# Patient Record
Sex: Female | Born: 1950 | Hispanic: No | Marital: Married
Health system: Southern US, Community
[De-identification: ages and names within clinical notes are randomized; demographics above are authoritative.]

---

## 2015-11-17 ENCOUNTER — Other Ambulatory Visit (HOSPITAL_COMMUNITY): Payer: Self-pay

## 2015-11-17 ENCOUNTER — Inpatient Hospital Stay
Admission: AD | Admit: 2015-11-17 | Discharge: 2015-12-16 | Disposition: A | Discharge status: Skilled Nursing Facility | Payer: Self-pay | Source: Ambulatory Visit | Attending: Internal Medicine | Admitting: Internal Medicine

## 2015-11-17 DIAGNOSIS — R4182 Altered mental status, unspecified: Secondary | ICD-10-CM | POA: Insufficient documentation

## 2015-11-17 DIAGNOSIS — J9690 Respiratory failure, unspecified, unspecified whether with hypoxia or hypercapnia: Secondary | ICD-10-CM

## 2015-11-17 DIAGNOSIS — Z4659 Encounter for fitting and adjustment of other gastrointestinal appliance and device: Secondary | ICD-10-CM

## 2015-11-17 DIAGNOSIS — N390 Urinary tract infection, site not specified: Secondary | ICD-10-CM | POA: Insufficient documentation

## 2015-11-17 DIAGNOSIS — J9611 Chronic respiratory failure with hypoxia: Secondary | ICD-10-CM | POA: Insufficient documentation

## 2015-11-17 DIAGNOSIS — Z930 Tracheostomy status: Secondary | ICD-10-CM | POA: Insufficient documentation

## 2015-11-17 DIAGNOSIS — J189 Pneumonia, unspecified organism: Secondary | ICD-10-CM

## 2015-11-17 DIAGNOSIS — J9600 Acute respiratory failure, unspecified whether with hypoxia or hypercapnia: Secondary | ICD-10-CM

## 2015-11-18 DIAGNOSIS — J9611 Chronic respiratory failure with hypoxia: Secondary | ICD-10-CM | POA: Insufficient documentation

## 2015-11-18 DIAGNOSIS — J9601 Acute respiratory failure with hypoxia: Secondary | ICD-10-CM

## 2015-11-18 DIAGNOSIS — R0902 Hypoxemia: Secondary | ICD-10-CM

## 2015-11-18 DIAGNOSIS — N390 Urinary tract infection, site not specified: Secondary | ICD-10-CM | POA: Insufficient documentation

## 2015-11-18 DIAGNOSIS — R4182 Altered mental status, unspecified: Secondary | ICD-10-CM | POA: Insufficient documentation

## 2015-11-18 DIAGNOSIS — J810 Acute pulmonary edema: Secondary | ICD-10-CM

## 2015-11-18 DIAGNOSIS — Z930 Tracheostomy status: Secondary | ICD-10-CM

## 2015-11-18 LAB — COMPREHENSIVE METABOLIC PANEL
ALBUMIN: 2.5 g/dL — AB (ref 3.5–5.0)
ALT: 60 U/L — AB (ref 14–54)
ANION GAP: 11 (ref 5–15)
AST: 80 U/L — ABNORMAL HIGH (ref 15–41)
Alkaline Phosphatase: 87 U/L (ref 38–126)
BUN: 35 mg/dL — ABNORMAL HIGH (ref 6–20)
CHLORIDE: 116 mmol/L — AB (ref 101–111)
CO2: 21 mmol/L — AB (ref 22–32)
CREATININE: 1.09 mg/dL — AB (ref 0.44–1.00)
Calcium: 9.3 mg/dL (ref 8.9–10.3)
GFR calc non Af Amer: 53 mL/min — ABNORMAL LOW (ref >60)
GLUCOSE: 178 mg/dL — AB (ref 65–99)
Potassium: 4.4 mmol/L (ref 3.5–5.1)
SODIUM: 148 mmol/L — AB (ref 135–145)
Total Bilirubin: 1 mg/dL (ref 0.3–1.2)
Total Protein: 7.8 g/dL (ref 6.5–8.1)

## 2015-11-18 LAB — BLOOD GAS, ARTERIAL
Acid-base deficit: 2.1 mmol/L — ABNORMAL HIGH (ref 0.0–2.0)
BICARBONATE: 22.1 meq/L (ref 20.0–24.0)
FIO2: 0.3
O2 Saturation: 98.6 %
PATIENT TEMPERATURE: 98.6
PEEP: 5 cmH2O
RATE: 14 resp/min
TCO2: 23.2 mmol/L (ref 0–100)
VT: 500 mL
pCO2 arterial: 36.8 mmHg (ref 35.0–45.0)
pH, Arterial: 7.395 (ref 7.350–7.450)
pO2, Arterial: 131 mmHg — ABNORMAL HIGH (ref 80.0–100.0)

## 2015-11-18 LAB — CBC
HCT: 27.7 % — ABNORMAL LOW (ref 36.0–46.0)
Hemoglobin: 8.3 g/dL — ABNORMAL LOW (ref 12.0–15.0)
MCH: 28.2 pg (ref 26.0–34.0)
MCHC: 30 g/dL (ref 30.0–36.0)
MCV: 94.2 fL (ref 78.0–100.0)
Platelets: 682 10*3/uL — ABNORMAL HIGH (ref 150–400)
RBC: 2.94 MIL/uL — AB (ref 3.87–5.11)
RDW: 15.4 % (ref 11.5–15.5)
WBC: 15.3 10*3/uL — ABNORMAL HIGH (ref 4.0–10.5)

## 2015-11-18 LAB — C DIFFICILE QUICK SCREEN W PCR REFLEX
C DIFFICILE (CDIFF) INTERP: NEGATIVE
C DIFFICILE (CDIFF) TOXIN: NEGATIVE
C Diff antigen: NEGATIVE

## 2015-11-18 LAB — URINALYSIS, ROUTINE W REFLEX MICROSCOPIC
BILIRUBIN URINE: NEGATIVE
GLUCOSE, UA: NEGATIVE mg/dL
KETONES UR: NEGATIVE mg/dL
NITRITE: NEGATIVE
PROTEIN: 100 mg/dL — AB
Specific Gravity, Urine: 1.016 (ref 1.005–1.030)
pH: 6 (ref 5.0–8.0)

## 2015-11-18 LAB — URINE MICROSCOPIC-ADD ON

## 2015-11-18 NOTE — Consult Note (Signed)
Name: GAYLON MELCHOR MRN: [id number] DOB: 1951/04/20    ADMISSION DATE:  11/17/2015 CONSULTATION DATE:  11/17/15  REFERRING MD :  Sharyon Medicus  CHIEF COMPLAINT:  Vent Management   HISTORY OF PRESENT ILLNESS:  ANNEKA STUDER is a 65 y.o. 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  She presented to [facility] 09/24/15 with acute bronchitis.  Due to concern for PE during early hospitalization, she was started on lovenox.  Her course was unfortunately complicated by retroperitoneal bleeding with RPH and subsequent hemorrhagic shock.  She received massive blood transfusions for several days / multiple occasions.  Her course was further complicated by Klebsiella pna as well as E.coli UTI (placed on zosyn, vanc, primaxin initially then transitioned to rocephin).  During her prolonged hospitalization, she required tracheostomy and was briefly placed on intermittent HD for acute renal failure which has since resolved. She had ongoing anemia which was eventually attributed to bone marrow suppression from HD, sepsis, and multiple transfusions.  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  She was listed as DNR prior to transfer but family was not ready for palliative care discussions. On her paperwork in [facility], code status is listed as limited code (no CPR or defibrillations).  PCCM was consulted for vent management.  PAST MEDICAL HISTORY :   has no past medical history on file.  has no past surgical history on file. Prior to Admission medications   Not on File   Allergies not on file  FAMILY HISTORY:  family history is not on file. SOCIAL HISTORY:    REVIEW OF  SYSTEMS:   Unable to obtain as pt is encephalopathic.   SUBJECTIVE:  On vent, trach in place.   VITAL SIGNS: T 98.3, P 95, R 24, BP 134 / 54, SpO2 98%.    PHYSICAL EXAMINATION: General: Chronically ill appearing female, resting in bed, in NAD. Neuro: Opens eyes. Does not follow all commands. HEENT: [place]/AT. PERRL, sclerae anicteric. Trach in place. Cardiovascular: RRR, no M/R/G.  Lungs: Respirations even and unlabored.  Coarse bilaterally. Abdomen: BS x 4, soft, NT/ND.  Musculoskeletal: No gross deformities, 1+ edema. Skin: Intact, warm, no rashes.     Recent Labs Lab 11/18/15 1158  NA 148*  K 4.4  CL 116*  CO2 21*  BUN 35*  CREATININE 1.09*  GLUCOSE 178*    Recent Labs Lab 11/18/15 1158  HGB 8.3*  HCT 27.7*  WBC 15.3*  PLT 682*   Dg Chest Port 1 View  11/18/2015  CLINICAL DATA:  65 year old female with NG tube placement EXAM: PORTABLE ABDOMEN - 1 VIEW; PORTABLE CHEST - 1 VIEW COMPARISON:  None. FINDINGS: An enteric tube is noted with tip in the left upper abdomen likely in the proximal stomach. Single portable view of the chest demonstrate mild cardiomegaly with increased central vascular prominence compatible with congestive changes. No focal consolidation, pleural effusion, or pneumothorax. Left subclavian central line with tip over the mediastinum noted. Evaluation of the positioning of the central line is limited due to patient rotation. No dilated bowel noted in the visualized abdomen. The osseous structures appear intact. IMPRESSION: Enteric tube with tip likely in the proximal stomach.  Mild cardiomegaly with mild congestive changes. Electronically Signed   By: Elgie Collard M.D.   On: 11/18/2015 01:01   Dg Abd Portable 1v  11/18/2015  CLINICAL DATA:  65 year old female with NG tube placement EXAM: PORTABLE ABDOMEN - 1 VIEW; PORTABLE CHEST - 1 VIEW COMPARISON:  None. FINDINGS: An enteric tube is noted with tip in the left upper abdomen likely in the proximal  stomach. Single portable view of the chest demonstrate mild cardiomegaly with increased central vascular prominence compatible with congestive changes. No focal consolidation, pleural effusion, or pneumothorax. Left subclavian central line with tip over the mediastinum noted. Evaluation of the positioning of the central line is limited due to patient rotation. No dilated bowel noted in the visualized abdomen. The osseous structures appear intact. IMPRESSION: Enteric tube with tip likely in the proximal stomach. Mild cardiomegaly with mild congestive changes. Electronically Signed   By: Elgie Collard M.D.   On: 11/18/2015 01:01    STUDIES:  CXR 02/15 > mild cardiomegaly.  SIGNIFICANT EVENTS  02/14 > admitted to [facility] after prolonged stay at [facility].  ASSESSMENT / PLAN:  VDRF - s/p tracheostomy due to failure to wean. Tracheostomy status. Hx klebsiella pna - completed abx course. Plan: Continue to wean per normal protocol. Continue BD's. Pulmonary hygiene. Follow CXR.  CC time:  30 minutes.  Rest per primary team.    Rutherford Guys, PA - [facility] [place] [facility] Pager: [phone]  or [phone] 11/18/2015, 4:52 PM   Attending Note:  65 year old female with extensive PMH presenting to [facility] after a prolonged hospitalization in [facility] where she presented with bronchitis that was later found to be HCAP, had bleeding due to lovenox as well. Was intubated, unable to extubate patient was trached. Also had AKI that has not resolved. In [facility] for rehab. I reviewed CXR myself with cardiomegaly noted as well as some signs of CHF. Discussed with SSH-MD and PCCM-NP. On exam, very coarse BS in all lung fields.  Acute respiratory failure due to HCAP and pulmonary edema. - Full vent support. - Begin weaning per protocol in AM.  Hypoxemia: due to above. - Titrate O2 for sat of 88-92%. - PEEP  to 5.  Pulmonary edema: - Diureses. - 2D echo.  Tracheostomy status: - Maintain cuffed trach. - Maintain current size.  Patient seen and examined, agree with above note. I dictated the care and orders written for this patient under my direction.  Alyson Reedy, MD [phone]

## 2015-11-19 DIAGNOSIS — J9611 Chronic respiratory failure with hypoxia: Secondary | ICD-10-CM

## 2015-11-19 NOTE — Progress Notes (Signed)
Name: Nicole Weiss MRN: [id number] DOB: Oct 23, 1950    ADMISSION DATE:  11/17/2015 CONSULTATION DATE:  11/17/15  REFERRING MD :  Sharyon Medicus  CHIEF COMPLAINT:  Vent Management   HISTORY OF PRESENT ILLNESS:  Nicole Weiss is a 65 y.o. 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  She presented to [facility] 09/24/15 with acute bronchitis.  Due to concern for PE during early hospitalization, she was started on lovenox.  Her course was unfortunately complicated by retroperitoneal bleeding with RPH and subsequent hemorrhagic shock.  She received massive blood transfusions for several days / multiple occasions.  Her course was further complicated by Klebsiella pna as well as E.coli UTI (placed on zosyn, vanc, primaxin initially then transitioned to rocephin).  During her prolonged hospitalization, she required tracheostomy and was briefly placed on intermittent HD for acute renal failure which has since resolved. She had ongoing anemia which was eventually attributed to bone marrow suppression from HD, sepsis, and multiple transfusions.  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  She was listed as DNR prior to transfer but family was not ready for palliative care discussions. On her paperwork in [facility], code status is listed as limited code (no CPR or defibrillations).  PCCM was consulted for vent management.    SUBJECTIVE:  On vent, trach in place. Tearful Tolerating pressure support 12/5   VITAL SIGNS: Afebrile, heart rate 90s, sinus on monitor,    PHYSICAL EXAMINATION: General: Chronically ill appearing female,  tearful Neuro: Opens eyes. Does not follow  all commands. HEENT: [place]/AT. PERRL, sclerae anicteric. Trach in place. Cardiovascular: RRR, no M/R/G.  Lungs: Respirations even and unlabored.  Coarse bilaterally. Abdomen: BS x 4, soft, NT/ND.  Musculoskeletal: No gross deformities, 1+ edema. Skin: Intact, warm, no rashes.     Recent Labs Lab 11/18/15 1158  NA 148*  K 4.4  CL 116*  CO2 21*  BUN 35*  CREATININE 1.09*  GLUCOSE 178*    Recent Labs Lab 11/18/15 1158  HGB 8.3*  HCT 27.7*  WBC 15.3*  PLT 682*   Dg Chest Port 1 View  11/18/2015  CLINICAL DATA:  65 year old female with NG tube placement EXAM: PORTABLE ABDOMEN - 1 VIEW; PORTABLE CHEST - 1 VIEW COMPARISON:  None. FINDINGS: An enteric tube is noted with tip in the left upper abdomen likely in the proximal stomach. Single portable view of the chest demonstrate mild cardiomegaly with increased central vascular prominence compatible with congestive changes. No focal consolidation, pleural effusion, or pneumothorax. Left subclavian central line with tip over the mediastinum noted. Evaluation of the positioning of the central line is limited due to patient rotation. No dilated bowel noted in the visualized abdomen. The osseous structures appear intact. IMPRESSION: Enteric tube with tip likely in the proximal stomach. Mild cardiomegaly with mild congestive changes. Electronically Signed   By: Elgie Collard M.D.   On: 11/18/2015 01:01   Dg Abd Portable 1v  11/18/2015  CLINICAL DATA:  65 year old female with NG tube placement EXAM: PORTABLE ABDOMEN - 1 VIEW; PORTABLE CHEST - 1 VIEW COMPARISON:  None. FINDINGS: An enteric tube is noted with tip in the left upper abdomen likely  in the proximal stomach. Single portable view of the chest demonstrate mild cardiomegaly with increased central vascular prominence compatible with congestive changes. No focal consolidation, pleural effusion, or pneumothorax. Left subclavian central line with tip over the mediastinum noted. Evaluation of the  positioning of the central line is limited due to patient rotation. No dilated bowel noted in the visualized abdomen. The osseous structures appear intact. IMPRESSION: Enteric tube with tip likely in the proximal stomach. Mild cardiomegaly with mild congestive changes. Electronically Signed   By: Elgie Collard M.D.   On: 11/18/2015 01:01    STUDIES:  CXR 02/15 > mild cardiomegaly.  SIGNIFICANT EVENTS  02/14 > admitted to [facility] after prolonged stay at [facility].  ASSESSMENT / PLAN:  VDRF - s/p tracheostomy due to failure to wean. Tracheostomy status. Hx klebsiella pna - completed abx course. Plan: Fast-track trach collar per protocol Continue BD's. Pulmonary hygiene. She would likely not be a candidate for decannulation  Pulmonary edema: - Diureses.   Dysphagia- continue nasogastric feeding tube, not a candidate for PEG placement due to prior bypass surgery, hope that we can progress to swallow evaluation once liberated from the ventilator   Cyril Mourning MD. FCCP. [place] [facility] Pager [phone] If no response call [phone]   11/19/2015, 12:08 PM

## 2015-11-20 ENCOUNTER — Other Ambulatory Visit (HOSPITAL_COMMUNITY): Payer: Self-pay

## 2015-11-20 LAB — BASIC METABOLIC PANEL
ANION GAP: 10 (ref 5–15)
BUN: 27 mg/dL — ABNORMAL HIGH (ref 6–20)
CHLORIDE: 113 mmol/L — AB (ref 101–111)
CO2: 20 mmol/L — ABNORMAL LOW (ref 22–32)
CREATININE: 0.96 mg/dL (ref 0.44–1.00)
Calcium: 9.4 mg/dL (ref 8.9–10.3)
GFR calc non Af Amer: >60 mL/min (ref >60)
Glucose, Bld: 169 mg/dL — ABNORMAL HIGH (ref 65–99)
POTASSIUM: 4.6 mmol/L (ref 3.5–5.1)
SODIUM: 143 mmol/L (ref 135–145)

## 2015-11-22 ENCOUNTER — Other Ambulatory Visit (HOSPITAL_COMMUNITY): Payer: Self-pay

## 2015-11-22 LAB — CBC
HEMATOCRIT: 29.1 % — AB (ref 36.0–46.0)
Hemoglobin: 8.4 g/dL — ABNORMAL LOW (ref 12.0–15.0)
MCH: 27.5 pg (ref 26.0–34.0)
MCHC: 28.9 g/dL — ABNORMAL LOW (ref 30.0–36.0)
MCV: 95.4 fL (ref 78.0–100.0)
Platelets: 642 10*3/uL — ABNORMAL HIGH (ref 150–400)
RBC: 3.05 MIL/uL — ABNORMAL LOW (ref 3.87–5.11)
RDW: 15.3 % (ref 11.5–15.5)
WBC: 16 10*3/uL — AB (ref 4.0–10.5)

## 2015-11-22 LAB — BASIC METABOLIC PANEL
ANION GAP: 13 (ref 5–15)
BUN: 28 mg/dL — ABNORMAL HIGH (ref 6–20)
CO2: 20 mmol/L — AB (ref 22–32)
Calcium: 9.3 mg/dL (ref 8.9–10.3)
Chloride: 109 mmol/L (ref 101–111)
Creatinine, Ser: 0.88 mg/dL (ref 0.44–1.00)
GFR calc Af Amer: >60 mL/min (ref >60)
GFR calc non Af Amer: >60 mL/min (ref >60)
Glucose, Bld: 159 mg/dL — ABNORMAL HIGH (ref 65–99)
Potassium: 5 mmol/L (ref 3.5–5.1)
Sodium: 142 mmol/L (ref 135–145)

## 2015-11-23 LAB — CBC
HCT: 28.1 % — ABNORMAL LOW (ref 36.0–46.0)
Hemoglobin: 8.5 g/dL — ABNORMAL LOW (ref 12.0–15.0)
MCH: 28.1 pg (ref 26.0–34.0)
MCHC: 30.2 g/dL (ref 30.0–36.0)
MCV: 92.7 fL (ref 78.0–100.0)
PLATELETS: 634 10*3/uL — AB (ref 150–400)
RBC: 3.03 MIL/uL — AB (ref 3.87–5.11)
RDW: 15.3 % (ref 11.5–15.5)
WBC: 17.4 10*3/uL — AB (ref 4.0–10.5)

## 2015-11-23 NOTE — Progress Notes (Signed)
Name: Nicole Weiss MRN: [id number] DOB: [birth year]    ADMISSION DATE:  11/17/2015 CONSULTATION DATE:  11/17/15  REFERRING MD :  Sharyon Medicus  CHIEF COMPLAINT:  Vent Management   HISTORY OF PRESENT ILLNESS:  Nicole Weiss is a 65 y.o. 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  She presented to [facility] 09/24/15 with acute bronchitis.  Due to concern for PE during early hospitalization, she was started on lovenox.  Her course was unfortunately complicated by retroperitoneal bleeding with RPH and subsequent hemorrhagic shock.  She received massive blood transfusions for several days / multiple occasions.  Her course was further complicated by Klebsiella pna as well as E.coli UTI (placed on zosyn, vanc, primaxin initially then transitioned to rocephin).  During her prolonged hospitalization, she required tracheostomy and was briefly placed on intermittent HD for acute renal failure which has since resolved. She had ongoing anemia which was eventually attributed to bone marrow suppression from HD, sepsis, and multiple transfusions.  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  She was listed as DNR prior to transfer but family was not ready for palliative care discussions. On her paperwork in [facility], code status is listed as limited code (no CPR or defibrillations).  PCCM was consulted for vent management.    SUBJECTIVE:   Has been on ATC x 48h   VITAL SIGNS: Afebrile, heart rate 90s, sinus on monitor,    PHYSICAL EXAMINATION: General: Chronically ill appearing female,  tearful Neuro: Opens eyes. Does not follow all commands. HEENT: [place]/AT. PERRL, sclerae  anicteric. Trach in place. Cardiovascular: RRR, no M/R/G.  Lungs: Respirations even and unlabored.  Coarse bilaterally. Abdomen: BS x 4, soft, NT/ND.  Musculoskeletal: No gross deformities, 1+ edema. Skin: Intact, warm, no rashes.     Recent Labs Lab 11/18/15 1158 11/20/15 1130 11/22/15 0535  NA 148* 143 142  K 4.4 4.6 5.0  CL 116* 113* 109  CO2 21* 20* 20*  BUN 35* 27* 28*  CREATININE 1.09* 0.96 0.88  GLUCOSE 178* 169* 159*    Recent Labs Lab 11/18/15 1158 11/22/15 0535 11/23/15 0707  HGB 8.3* 8.4* 8.5*  HCT 27.7* 29.1* 28.1*  WBC 15.3* 16.0* 17.4*  PLT 682* 642* 634*   Dg Chest Port 1 View  11/22/2015  CLINICAL DATA:  Pneumonia EXAM: PORTABLE CHEST 1 VIEW COMPARISON:  11/17/2015 chest radiograph. FINDINGS: Tracheostomy tube tip overlies the tracheal air column at the thoracic inlet. Left internal jugular central venous catheter terminates in the right atrium near the cavoatrial junction. Stable cardiomediastinal silhouette with mild cardiomegaly. No pneumothorax. Stable small left pleural effusion. No right pleural effusion. Low lung volumes. Vascular crowding without overt pulmonary edema. Improved aeration at the left lung base, with decreased hazy left lower lobe opacity. Platelike atelectasis at the right lung base, stable. IMPRESSION: 1. Low lung volumes with stable platelike atelectasis at the right lung base and decreased hazy left lower lobe opacity, either atelectasis or pneumonia. 2. Stable small left pleural effusion. Electronically Signed   By: Delbert Phenix M.D.   On: 11/22/2015 09:45    STUDIES:  CXR 02/15 > mild cardiomegaly.  SIGNIFICANT EVENTS  02/14 > admitted  to [facility] after prolonged stay at [facility].  ASSESSMENT / PLAN:  VDRF - s/p tracheostomy due to failure to wean. Tracheostomy status. Hx klebsiella pna - completed abx course. Plan: Continue Trach collar 24x7 Continue BD's. Pulmonary hygiene. She would likely not be a candidate for  decannulation Assess for cuff down and PMV this week  Pulmonary edema: - Diureses.   Dysphagia- continue nasogastric feeding tube, not a candidate for PEG placement due to prior bypass surgery, hope that we can progress to swallow evaluation once liberated from the ventilator   Levy Pupa, MD, PhD 11/23/2015, 12:19 PM [place] [facility] [phone] or if no answer [phone]

## 2015-11-24 LAB — CBC
HCT: 28.2 % — ABNORMAL LOW (ref 36.0–46.0)
Hemoglobin: 8.3 g/dL — ABNORMAL LOW (ref 12.0–15.0)
MCH: 27.7 pg (ref 26.0–34.0)
MCHC: 29.4 g/dL — ABNORMAL LOW (ref 30.0–36.0)
MCV: 94 fL (ref 78.0–100.0)
PLATELETS: 646 10*3/uL — AB (ref 150–400)
RBC: 3 MIL/uL — AB (ref 3.87–5.11)
RDW: 15.2 % (ref 11.5–15.5)
WBC: 17.1 10*3/uL — ABNORMAL HIGH (ref 4.0–10.5)

## 2015-11-24 LAB — BASIC METABOLIC PANEL
Anion gap: 11 (ref 5–15)
BUN: 28 mg/dL — AB (ref 6–20)
CO2: 22 mmol/L (ref 22–32)
CREATININE: 0.93 mg/dL (ref 0.44–1.00)
Calcium: 9.4 mg/dL (ref 8.9–10.3)
Chloride: 106 mmol/L (ref 101–111)
Glucose, Bld: 179 mg/dL — ABNORMAL HIGH (ref 65–99)
POTASSIUM: 4.1 mmol/L (ref 3.5–5.1)
SODIUM: 139 mmol/L (ref 135–145)

## 2015-11-24 LAB — MAGNESIUM: MAGNESIUM: 1.5 mg/dL — AB (ref 1.7–2.4)

## 2015-11-24 LAB — PHOSPHORUS: Phosphorus: 4 mg/dL (ref 2.5–4.6)

## 2015-11-25 LAB — CULTURE, RESPIRATORY
CULTURE: NO GROWTH
GRAM STAIN: NONE SEEN

## 2015-11-25 LAB — BASIC METABOLIC PANEL
Anion gap: 11 (ref 5–15)
BUN: 26 mg/dL — AB (ref 6–20)
CO2: 24 mmol/L (ref 22–32)
CREATININE: 0.86 mg/dL (ref 0.44–1.00)
Calcium: 9.2 mg/dL (ref 8.9–10.3)
Chloride: 104 mmol/L (ref 101–111)
GFR calc Af Amer: >60 mL/min (ref >60)
Glucose, Bld: 178 mg/dL — ABNORMAL HIGH (ref 65–99)
POTASSIUM: 4.1 mmol/L (ref 3.5–5.1)
SODIUM: 139 mmol/L (ref 135–145)

## 2015-11-25 LAB — CBC
HCT: 27 % — ABNORMAL LOW (ref 36.0–46.0)
Hemoglobin: 8.4 g/dL — ABNORMAL LOW (ref 12.0–15.0)
MCH: 29.5 pg (ref 26.0–34.0)
MCHC: 31.1 g/dL (ref 30.0–36.0)
MCV: 94.7 fL (ref 78.0–100.0)
PLATELETS: 561 10*3/uL — AB (ref 150–400)
RBC: 2.85 MIL/uL — AB (ref 3.87–5.11)
RDW: 15.4 % (ref 11.5–15.5)
WBC: 15.7 10*3/uL — ABNORMAL HIGH (ref 4.0–10.5)

## 2015-11-25 LAB — CULTURE, RESPIRATORY W GRAM STAIN

## 2015-11-26 NOTE — Progress Notes (Signed)
Name: AWA BACHICHA MRN: [id number] DOB: April 22, 1951    ADMISSION DATE:  11/17/2015 CONSULTATION DATE:  11/17/15  REFERRING MD :  Sharyon Medicus  CHIEF COMPLAINT:  Vent Management   HISTORY OF PRESENT ILLNESS:  Nicole Weiss is a 65 y.o. 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  She presented to [facility] 09/24/15 with acute bronchitis.  Due to concern for PE during early hospitalization, she was started on lovenox.  Her course was unfortunately complicated by retroperitoneal bleeding with RPH and subsequent hemorrhagic shock.  She received massive blood transfusions for several days / multiple occasions.  Her course was further complicated by Klebsiella pna as well as E.coli UTI (placed on zosyn, vanc, primaxin initially then transitioned to rocephin).  During her prolonged hospitalization, she required tracheostomy and was briefly placed on intermittent HD for acute renal failure which has since resolved. She had ongoing anemia which was eventually attributed to bone marrow suppression from HD, sepsis, and multiple transfusions.  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  She was listed as DNR prior to transfer but family was not ready for palliative care discussions. On her paperwork in [facility], code status is listed as limited code (no CPR or defibrillations).  PCCM was consulted for vent management.    SUBJECTIVE:   On trach collar 20 hr goal   VITAL SIGNS: Afebrile, heart rate 90s, sinus on monitor,    PHYSICAL EXAMINATION: General: Chronically ill appearing female Neuro: Opens eyes. follows all commands. HEENT: [place]/AT. PERRL, sclerae anicteric.  Trach in place. Cardiovascular: RRR, no M/R/G.  Lungs: coarse reduced Abdomen: BS x 4, soft, NT/ND.  Musculoskeletal: No gross deformities, 1+ edema. Skin: Intact, warm, no rashes.     Recent Labs Lab 11/22/15 0535 11/24/15 0630 11/25/15 0730  NA 142 139 139  K 5.0 4.1 4.1  CL 109 106 104  CO2 20* 22 24  BUN 28* 28* 26*  CREATININE 0.88 0.93 0.86  GLUCOSE 159* 179* 178*    Recent Labs Lab 11/23/15 0707 11/24/15 0630 11/25/15 0730  HGB 8.5* 8.3* 8.4*  HCT 28.1* 28.2* 27.0*  WBC 17.4* 17.1* 15.7*  PLT 634* 646* 561*   No results found.  STUDIES:  CXR 02/15 > mild cardiomegaly.  SIGNIFICANT EVENTS  02/14 > admitted to [facility] after prolonged stay at [facility].  ASSESSMENT / PLAN:  VDRF - s/p tracheostomy due to failure to wean. Tracheostomy status. Hx klebsiella pna - completed abx course. pulm edema Plan: Advance to 24 / 7, no role to stop at 20 hrs Lasix per Dr Rexene Edison PMV advance to all day Secretion low Consider to 4 cuffless  - Monday if did well on TC throughout Then swallow test further Mobility as able   Mcarthur Rossetti. Tyson Alias, MD, FACP Pgr: [phone] [place] [facility]

## 2015-11-28 ENCOUNTER — Other Ambulatory Visit (HOSPITAL_COMMUNITY): Payer: Self-pay

## 2015-11-28 LAB — BASIC METABOLIC PANEL
Anion gap: 13 (ref 5–15)
BUN: 26 mg/dL — AB (ref 6–20)
CALCIUM: 9.5 mg/dL (ref 8.9–10.3)
CO2: 24 mmol/L (ref 22–32)
Chloride: 106 mmol/L (ref 101–111)
Creatinine, Ser: 0.85 mg/dL (ref 0.44–1.00)
GFR calc Af Amer: >60 mL/min (ref >60)
GLUCOSE: 149 mg/dL — AB (ref 65–99)
POTASSIUM: 4.8 mmol/L (ref 3.5–5.1)
Sodium: 143 mmol/L (ref 135–145)

## 2015-11-28 LAB — CBC
HCT: 27.3 % — ABNORMAL LOW (ref 36.0–46.0)
Hemoglobin: 8.1 g/dL — ABNORMAL LOW (ref 12.0–15.0)
MCH: 27.5 pg (ref 26.0–34.0)
MCHC: 29.7 g/dL — AB (ref 30.0–36.0)
MCV: 92.5 fL (ref 78.0–100.0)
PLATELETS: 555 10*3/uL — AB (ref 150–400)
RBC: 2.95 MIL/uL — ABNORMAL LOW (ref 3.87–5.11)
RDW: 15.1 % (ref 11.5–15.5)
WBC: 17 10*3/uL — ABNORMAL HIGH (ref 4.0–10.5)

## 2015-11-28 LAB — C DIFFICILE QUICK SCREEN W PCR REFLEX
C Diff antigen: NEGATIVE
C Diff interpretation: NEGATIVE
C Diff toxin: NEGATIVE

## 2015-11-30 NOTE — Progress Notes (Signed)
Name: Nicole Weiss MRN: [id number] DOB: 10-Sep-1951    ADMISSION DATE:  11/17/2015 CONSULTATION DATE:  11/17/15  REFERRING MD :  Sharyon Medicus  CHIEF COMPLAINT:  Vent Management   HISTORY OF PRESENT ILLNESS:  Nicole Weiss is a 65 y.o. 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  She presented to [facility] 09/24/15 with acute bronchitis.  Due to concern for PE during early hospitalization, she was started on lovenox.  Her course was unfortunately complicated by retroperitoneal bleeding with RPH and subsequent hemorrhagic shock.  She received massive blood transfusions for several days / multiple occasions.  Her course was further complicated by Klebsiella pna as well as E.coli UTI (placed on zosyn, vanc, primaxin initially then transitioned to rocephin).  During her prolonged hospitalization, she required tracheostomy and was briefly placed on intermittent HD for acute renal failure which has since resolved. She had ongoing anemia which was eventually attributed to bone marrow suppression from HD, sepsis, and multiple transfusions.  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  She was listed as DNR prior to transfer but family was not ready for palliative care discussions. On her paperwork in [facility], code status is listed as limited code (no CPR or defibrillations).  PCCM was consulted for vent management.  SUBJECTIVE:   Tolerating TC 24/7.  VITAL SIGNS: Afebrile, heart rate 90s, sinus on monitor, RR 20's and sat of 100% on TC.   PHYSICAL EXAMINATION: General: Chronically ill appearing female Neuro: Opens eyes. follows all commands. HEENT: [place]/AT. PERRL,  sclerae anicteric. Trach in place. Cardiovascular: RRR, no M/R/G.  Lungs: coarse reduced Abdomen: BS x 4, soft, NT/ND.  Musculoskeletal: No gross deformities, 1+ edema. Skin: Intact, warm, no rashes.   Recent Labs Lab 11/24/15 0630 11/25/15 0730 11/28/15 0646  NA 139 139 143  K 4.1 4.1 4.8  CL 106 104 106  CO2 BUN 28* 26* 26*  CREATININE 0.93 0.86 0.85  GLUCOSE 179* 178* 149*    Recent Labs Lab 11/24/15 0630 11/25/15 0730 11/28/15 0646  HGB 8.3* 8.4* 8.1*  HCT 28.2* 27.0* 27.3*  WBC 17.1* 15.7* 17.0*  PLT 646* 561* 555*   No results found.  STUDIES:  I reviewed CXR myself, trach in good position, cardiomegaly   SIGNIFICANT EVENTS  02/14 > admitted to [facility] after prolonged stay at [facility].  ASSESSMENT / PLAN:  VDRF - s/p tracheostomy due to failure to wean. Tracheostomy status. Hx klebsiella pna - completed abx course. pulm edema Plan: TC 24/7. Keep current trach in place, order a 5 XLT cuffless, no shiley then may downsize. Lasix to keep dry as able. Swallow evaluation. Speech to evaluate for PMV. Mobility as able No decannulation given morbid obesity and poor functional stauts.  Discussed with RT bedside.  Alyson Reedy, M.D. [facility]. Pager: [phone]. After hours pager: [phone].

## 2015-12-03 NOTE — Progress Notes (Signed)
Name: Nicole Weiss MRN: [id number] DOB: 11/12/1950    ADMISSION DATE:  11/17/2015 CONSULTATION DATE:  11/17/15  REFERRING MD :  Sharyon Medicus  CHIEF COMPLAINT:  Vent Management   HISTORY OF PRESENT ILLNESS:  Nicole Weiss is a 65 y.o. 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  She presented to [facility] 09/24/15 with acute bronchitis.  Due to concern for PE during early hospitalization, she was started on lovenox.  Her course was unfortunately complicated by retroperitoneal bleeding with RPH and subsequent hemorrhagic shock.  She received massive blood transfusions for several days / multiple occasions.  Her course was further complicated by Klebsiella pna as well as E.coli UTI (placed on zosyn, vanc, primaxin initially then transitioned to rocephin).  During her prolonged hospitalization, she required tracheostomy and was briefly placed on intermittent HD for acute renal failure which has since resolved. She had ongoing anemia which was eventually attributed to bone marrow suppression from HD, sepsis, and multiple transfusions.  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  She was listed as DNR prior to transfer but family was not ready for palliative care discussions. On her paperwork in [facility], code status is listed as limited code (no CPR or defibrillations).  PCCM was consulted for vent management.  SUBJECTIVE:   Tolerating TC 24/7, PMV in place.  VITAL SIGNS: Afebrile, heart rate 90s, sinus on monitor, RR 20's and sat of 100% on TC.   PHYSICAL EXAMINATION: General: Chronically ill appearing female Neuro: Opens eyes. follows all commands. HEENT:  [place]/AT. PERRL, sclerae anicteric. Trach in place. Cardiovascular: RRR, no M/R/G.  Lungs: coarse reduced Abdomen: BS x 4, soft, NT/ND.  Musculoskeletal: No gross deformities, 1+ edema. Skin: Intact, warm, no rashes.   Recent Labs Lab 11/28/15 0646  NA 143  K 4.8  CL 106  CO2 24  BUN 26*  CREATININE 0.85  GLUCOSE 149*    Recent Labs Lab 11/28/15 0646  HGB 8.1*  HCT 27.3*  WBC 17.0*  PLT 555*   No results found.  STUDIES:  I reviewed CXR myself, trach in good position, cardiomegaly   SIGNIFICANT EVENTS  02/14 > admitted to [facility] after prolonged stay at [facility].  ASSESSMENT / PLAN:  VDRF - s/p tracheostomy due to failure to wean. Tracheostomy status. Hx klebsiella pna - completed abx course. pulm edema Plan: TC 24/7. Keep current trach in place, ordered a 5 XLT cuffless, not received yet, no shiley then may downsize. Lasix to keep dry as able. Swallow evaluation. PMV as able/needed Mobility as able No decannulation given morbid obesity and poor functional stauts.  Discussed with RT bedside.  Alyson Reedy, M.D. [facility] Pulmonary/Critical Care Medicine. Pager: [phone]. After hours pager: [phone].

## 2015-12-05 LAB — URINALYSIS, ROUTINE W REFLEX MICROSCOPIC
BILIRUBIN URINE: NEGATIVE
GLUCOSE, UA: NEGATIVE mg/dL
KETONES UR: NEGATIVE mg/dL
NITRITE: NEGATIVE
PH: 5.5 (ref 5.0–8.0)
Specific Gravity, Urine: 1.017 (ref 1.005–1.030)

## 2015-12-05 LAB — URINE MICROSCOPIC-ADD ON

## 2015-12-06 LAB — BASIC METABOLIC PANEL
ANION GAP: 13 (ref 5–15)
BUN: 34 mg/dL — ABNORMAL HIGH (ref 6–20)
CHLORIDE: 103 mmol/L (ref 101–111)
CO2: 27 mmol/L (ref 22–32)
Calcium: 10.3 mg/dL (ref 8.9–10.3)
Creatinine, Ser: 1.06 mg/dL — ABNORMAL HIGH (ref 0.44–1.00)
GFR calc non Af Amer: 55 mL/min — ABNORMAL LOW (ref >60)
Glucose, Bld: 150 mg/dL — ABNORMAL HIGH (ref 65–99)
Potassium: 4.4 mmol/L (ref 3.5–5.1)
Sodium: 143 mmol/L (ref 135–145)

## 2015-12-06 LAB — CBC
HEMATOCRIT: 30.5 % — AB (ref 36.0–46.0)
HEMOGLOBIN: 9.6 g/dL — AB (ref 12.0–15.0)
MCH: 29.2 pg (ref 26.0–34.0)
MCHC: 31.5 g/dL (ref 30.0–36.0)
MCV: 92.7 fL (ref 78.0–100.0)
Platelets: 521 10*3/uL — ABNORMAL HIGH (ref 150–400)
RBC: 3.29 MIL/uL — ABNORMAL LOW (ref 3.87–5.11)
RDW: 14.2 % (ref 11.5–15.5)
WBC: 15.4 10*3/uL — AB (ref 4.0–10.5)

## 2015-12-07 LAB — URINE CULTURE: Culture: 9000

## 2015-12-07 NOTE — Progress Notes (Signed)
Name: Nicole SmokeJoyce C Teas MRN: [id number] DOB: November 08, 1950    ADMISSION DATE:  11/17/2015 CONSULTATION DATE:  11/17/15  REFERRING MD :  Sharyon MedicusHijazi  CHIEF COMPLAINT:  Vent Management   HISTORY OF PRESENT ILLNESS:  Nicole Weiss is a 65 y.o. 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  [facility] with acute bronchitis.  Due to concern for PE during early hospitalization, she was started on lovenox.  Her course was unfortunately complicated by retroperitoneal bleeding with RPH and subsequent hemorrhagic shock.  She received massive blood transfusions for several days / multiple occasions.  Her course was further complicated by Klebsiella pna as well as E.coli UTI (placed on zosyn, vanc, primaxin initially then transitioned to rocephin).  During her prolonged hospitalization, she required tracheostomy and was briefly placed on intermittent HD for acute renal failure which has since resolved. She had ongoing anemia which was eventually attributed to bone marrow suppression from HD, sepsis, and multiple transfusions.  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  She was listed as DNR prior to transfer but family was not ready for palliative care discussions. [facility] status [facility] as limited code (no CPR or defibrillations).  PCCM was consulted for vent management.  SUBJECTIVE:   Pain in vagina, primarily with urination  VITAL SIGNS: Afebrile,  HR90, sinus on monitor, RR 30 and sat of 99% on 28% ATC      PHYSICAL EXAMINATION: General: Chronically ill appearing obese female Neuro: Opens eyes. follows all  commands. HEENT: [place]/AT. PERRL, sclerae anicteric. Trach in place. PMV in place, good phonation Cardiovascular: RRR, no M/R/G.  Lungs: diminished Abdomen: BS x 4, soft, NT/ND.  Musculoskeletal: No gross deformities, 1+ edema. Skin: Intact, warm, no rashes.   Recent Labs Lab 12/06/15 0627  NA 143  K 4.4  CL 103  CO2 27  BUN 34*  CREATININE 1.06*  GLUCOSE 150*    Recent Labs Lab 12/06/15 0627  HGB 9.6*  HCT 30.5*  WBC 15.4*  PLT 521*   No results found.  STUDIES:    SIGNIFICANT EVENTS  02/14 > [facility]  ASSESSMENT / PLAN:   VDRF - s/p tracheostomy due to failure to wean. Tracheostomy status. Hx klebsiella pna - completed abx course. Pulmonary edema  Plan: 28% ATC 24/7, tolerating PMV and diet well Keep current trach in place, awaiting #5 XLT coming into stock then will downsize. Continue PMV as tolerated. Once #5 XLT placed would start capping trials Not a candidate for decannulation given morbid obesity and poor functional stauts.   Dysuria - Discussed with attending physician, [facility]  Joneen RoachPaul Kimya Mccahill, AGACNP-BC [place] Pulmonology/Critical Care Pager [phone] or [phone]  12/07/2015 10:12 AM

## 2015-12-08 LAB — CBC
HCT: 30.8 % — ABNORMAL LOW (ref 36.0–46.0)
Hemoglobin: 9.5 g/dL — ABNORMAL LOW (ref 12.0–15.0)
MCH: 28.5 pg (ref 26.0–34.0)
MCHC: 30.8 g/dL (ref 30.0–36.0)
MCV: 92.5 fL (ref 78.0–100.0)
PLATELETS: 562 10*3/uL — AB (ref 150–400)
RBC: 3.33 MIL/uL — ABNORMAL LOW (ref 3.87–5.11)
RDW: 14.3 % (ref 11.5–15.5)
WBC: 13.5 10*3/uL — AB (ref 4.0–10.5)

## 2015-12-08 LAB — BASIC METABOLIC PANEL
ANION GAP: 14 (ref 5–15)
BUN: 31 mg/dL — ABNORMAL HIGH (ref 6–20)
CALCIUM: 10.1 mg/dL (ref 8.9–10.3)
CO2: 25 mmol/L (ref 22–32)
CREATININE: 1.05 mg/dL — AB (ref 0.44–1.00)
Chloride: 103 mmol/L (ref 101–111)
GFR, EST NON AFRICAN AMERICAN: 56 mL/min — AB (ref >60)
Glucose, Bld: 148 mg/dL — ABNORMAL HIGH (ref 65–99)
Potassium: 4.1 mmol/L (ref 3.5–5.1)
SODIUM: 142 mmol/L (ref 135–145)

## 2015-12-11 NOTE — Progress Notes (Signed)
Name: Nicole Weiss MRN: [id number] DOB: 14-Sep-1951    ADMISSION DATE:  11/17/2015 CONSULTATION DATE:  11/17/15  REFERRING MD :  Sharyon MedicusHijazi  CHIEF COMPLAINT:  Vent Management   HISTORY OF PRESENT ILLNESS:  Nicole Weiss is a 65 y.o. 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  [facility] with acute bronchitis.  Due to concern for PE during early hospitalization, she was started on lovenox.  Her course was unfortunately complicated by retroperitoneal bleeding with RPH and subsequent hemorrhagic shock.  She received massive blood transfusions for several days / multiple occasions.  Her course was further complicated by Klebsiella pna as well as E.coli UTI (placed on zosyn, vanc, primaxin initially then transitioned to rocephin).  During her prolonged hospitalization, she required tracheostomy and was briefly placed on intermittent HD for acute renal failure which has since resolved. She had ongoing anemia which was eventually attributed to bone marrow suppression from HD, sepsis, and multiple transfusions.  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  She was listed as DNR prior to transfer but family was not ready for palliative care discussions. [facility] limited code (no CPR or defibrillations).  PCCM was consulted for vent management.  SUBJECTIVE:   Denies chest pain or dyspnea  VITAL SIGNS: Afebrile,  HR88, sinus on monitor, RR 24 and sat of 99% on 28% ATC      PHYSICAL EXAMINATION: General: Chronically ill appearing obese female Neuro: Opens eyes. follows all commands. HEENT: [place]/AT.  PERRL, sclerae anicteric. Trach in place. PMV in place, good phonation Cardiovascular: RRR, no M/R/G.  Lungs: diminished Abdomen: BS x 4, soft, NT/ND.  Musculoskeletal: No gross deformities, 1+ edema. Skin: Intact, warm, no rashes.   Recent Labs Lab 12/06/15 0627 12/08/15 0633  NA 143 142  K 4.4 4.1  CL 103 103  CO2 27 25  BUN 34* 31*  CREATININE 1.06* 1.05*  GLUCOSE 150* 148*    Recent Labs Lab 12/06/15 0627 12/08/15 0633  HGB 9.6* 9.5*  HCT 30.5* 30.8*  WBC 15.4* 13.5*  PLT 521* 562*   No results found.  STUDIES:    SIGNIFICANT EVENTS  02/14 > [facility]  ASSESSMENT / PLAN:   VDRF - s/p tracheostomy due to failure to wean. Tracheostomy status. Hx klebsiella pna - completed abx course. Pulmonary edema  Plan: 28% ATC 24/7, tolerating PMV and diet well  Continue PMV as tolerated. Once #5 XLT placed would start capping trials Not a candidate for decannulation given morbid obesity and poor functional stauts. Can consider if she makes remarkable progress and becomes ambulatory. Would also need to see that she tolerated CPAP  [facility]  [facility]  Discussed on multidisciplinary rounds with select team  PCCM available as needed  12/11/2015 4:26 PM

## 2015-12-12 LAB — CBC WITH DIFFERENTIAL/PLATELET
Basophils Absolute: 0 10*3/uL (ref 0.0–0.1)
Basophils Relative: 0 %
EOS PCT: 2 %
Eosinophils Absolute: 0.2 10*3/uL (ref 0.0–0.7)
HCT: 29.1 % — ABNORMAL LOW (ref 36.0–46.0)
Hemoglobin: 8.8 g/dL — ABNORMAL LOW (ref 12.0–15.0)
LYMPHS ABS: 4.6 10*3/uL — AB (ref 0.7–4.0)
LYMPHS PCT: 34 %
MCH: 27.7 pg (ref 26.0–34.0)
MCHC: 30.2 g/dL (ref 30.0–36.0)
MCV: 91.5 fL (ref 78.0–100.0)
MONO ABS: 1.2 10*3/uL — AB (ref 0.1–1.0)
MONOS PCT: 9 %
NEUTROS ABS: 7.7 10*3/uL (ref 1.7–7.7)
NEUTROS PCT: 55 %
PLATELETS: 532 10*3/uL — AB (ref 150–400)
RBC: 3.18 MIL/uL — AB (ref 3.87–5.11)
RDW: 14.4 % (ref 11.5–15.5)
WBC: 13.8 10*3/uL — ABNORMAL HIGH (ref 4.0–10.5)

## 2015-12-12 LAB — BASIC METABOLIC PANEL
Anion gap: 12 (ref 5–15)
BUN: 22 mg/dL — AB (ref 6–20)
CO2: 25 mmol/L (ref 22–32)
Calcium: 9.8 mg/dL (ref 8.9–10.3)
Chloride: 105 mmol/L (ref 101–111)
Creatinine, Ser: 0.95 mg/dL (ref 0.44–1.00)
GFR calc Af Amer: >60 mL/min (ref >60)
GLUCOSE: 113 mg/dL — AB (ref 65–99)
POTASSIUM: 4.4 mmol/L (ref 3.5–5.1)
Sodium: 142 mmol/L (ref 135–145)

## 2015-12-12 LAB — MAGNESIUM: Magnesium: 1.5 mg/dL — ABNORMAL LOW (ref 1.7–2.4)

## 2015-12-12 LAB — PHOSPHORUS: Phosphorus: 5 mg/dL — ABNORMAL HIGH (ref 2.5–4.6)

## 2017-08-09 IMAGING — CR DG CHEST 1V PORT
1 series · 1 of 1 positions shown · non-contrast
Comparison: None.

CLINICAL DATA: 64-year-old female with NG tube placement

EXAM:
PORTABLE ABDOMEN - 1 VIEW; PORTABLE CHEST - 1 VIEW

[AP]
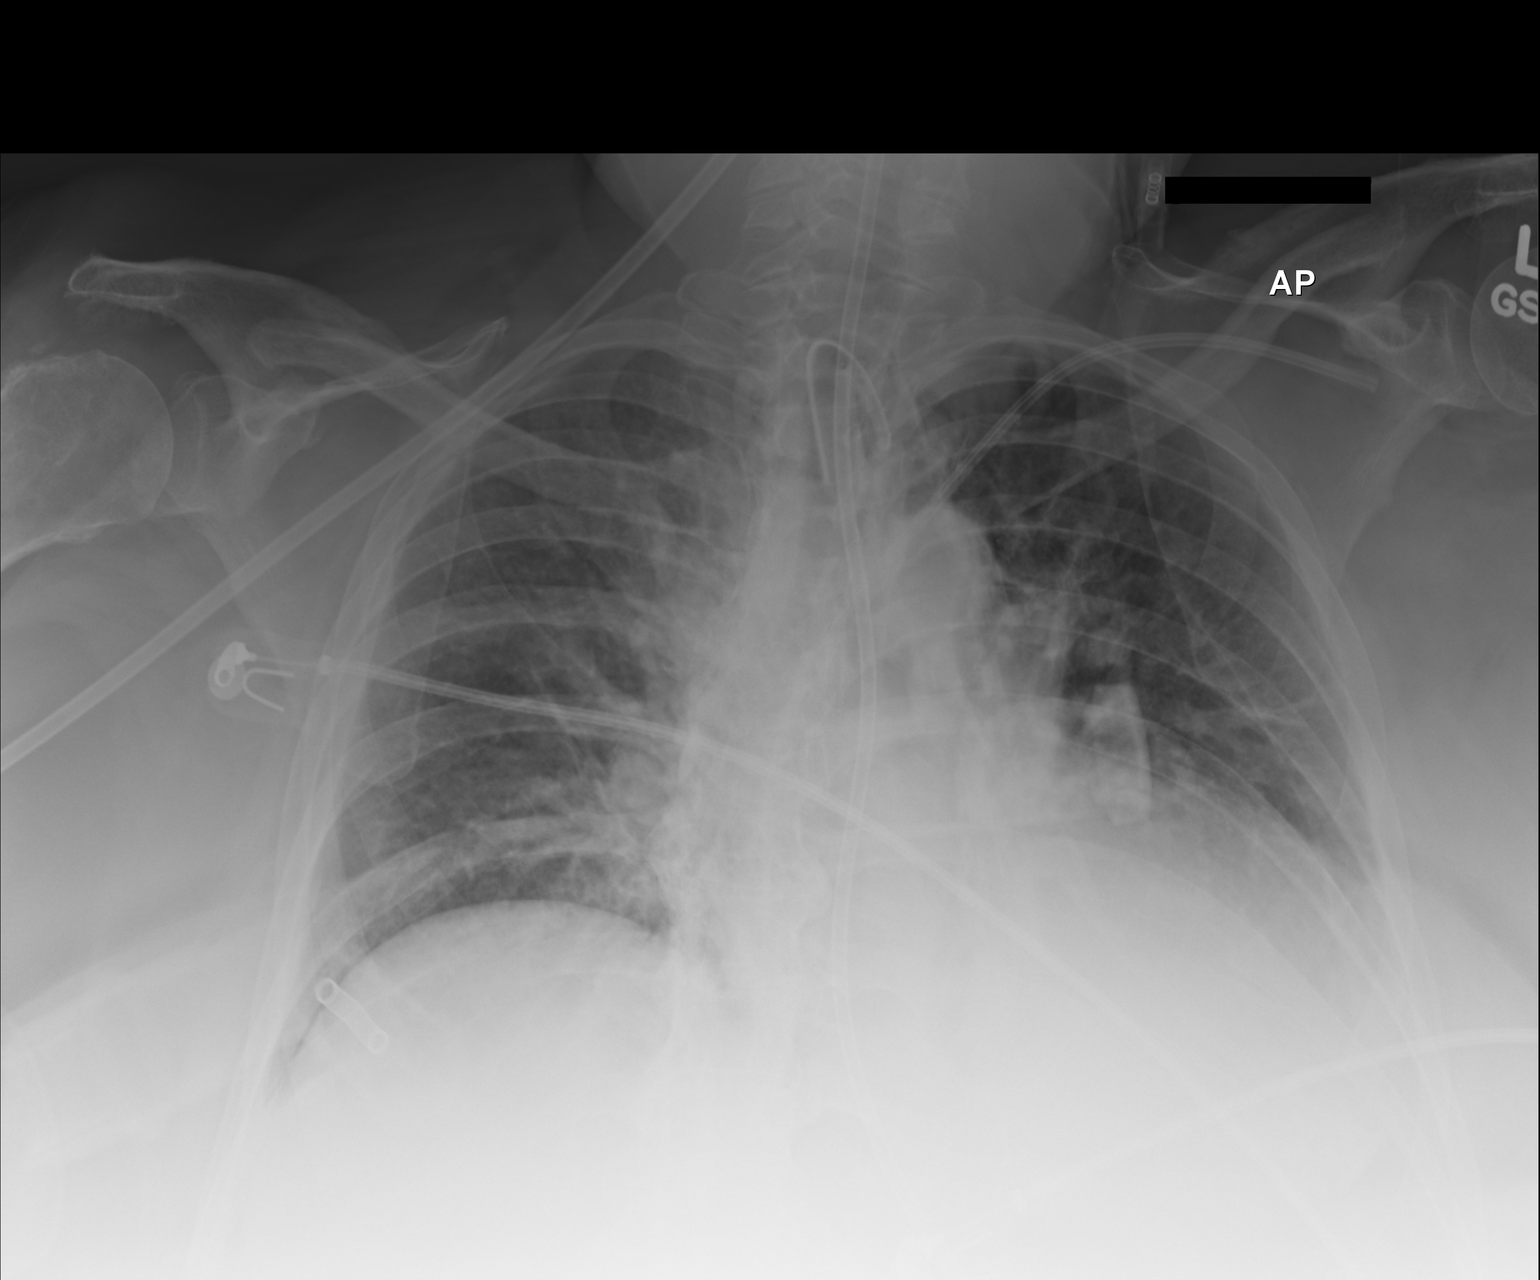

[1 of 1 positions shown; findings below may reference images not displayed]

FINDINGS: An enteric tube is noted with tip in the left upper abdomen likely
in the proximal stomach.

Single portable view of the chest demonstrate mild cardiomegaly with
increased central vascular prominence compatible with congestive
changes. No focal consolidation, pleural effusion, or pneumothorax.
Left subclavian central line with tip over the mediastinum noted.
Evaluation of the positioning of the central line is limited due to
patient rotation.

No dilated bowel noted in the visualized abdomen. The osseous
structures appear intact.
IMPRESSION: Enteric tube with tip likely in the proximal stomach.

Mild cardiomegaly with mild congestive changes.

## 2017-08-12 IMAGING — CR DG ABD PORTABLE 1V
1 series · 1 of 1 positions shown · non-contrast
Comparison: 11/18/2015

CLINICAL DATA: Nasogastric tube placement

EXAM:
PORTABLE ABDOMEN - 1 VIEW

[AP]
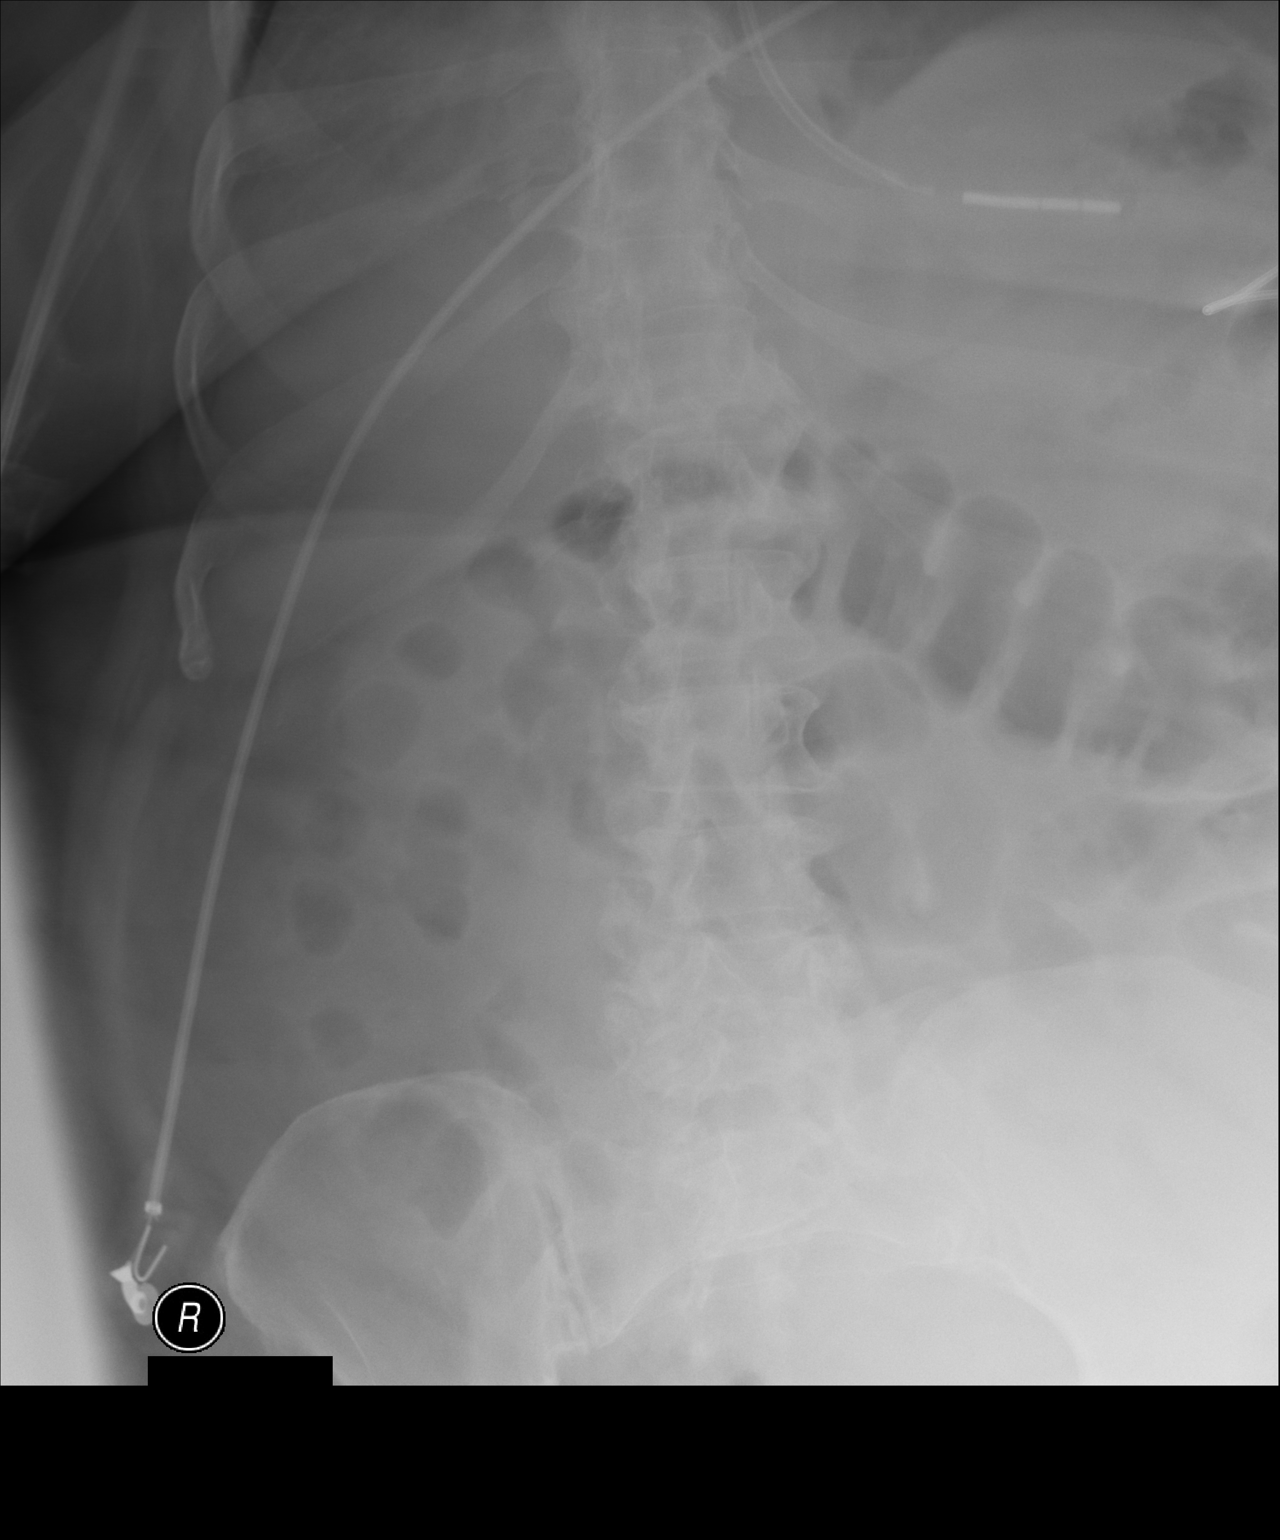

[1 of 1 positions shown; findings below may reference images not displayed]

FINDINGS: Enteric tube tip is in the left upper quadrant consistent with
location in the upper stomach. Side hole appears to be just past the
EG junction. Visualized bowel gas pattern is unremarkable.
IMPRESSION: Enteric tube tip is in the left upper quadrant, likely in the upper
stomach.

## 2017-08-14 IMAGING — CR DG CHEST 1V PORT
1 series · 1 of 1 positions shown · non-contrast
Comparison: 11/17/2015 chest radiograph.

CLINICAL DATA: Pneumonia

EXAM:
PORTABLE CHEST 1 VIEW

[AP]
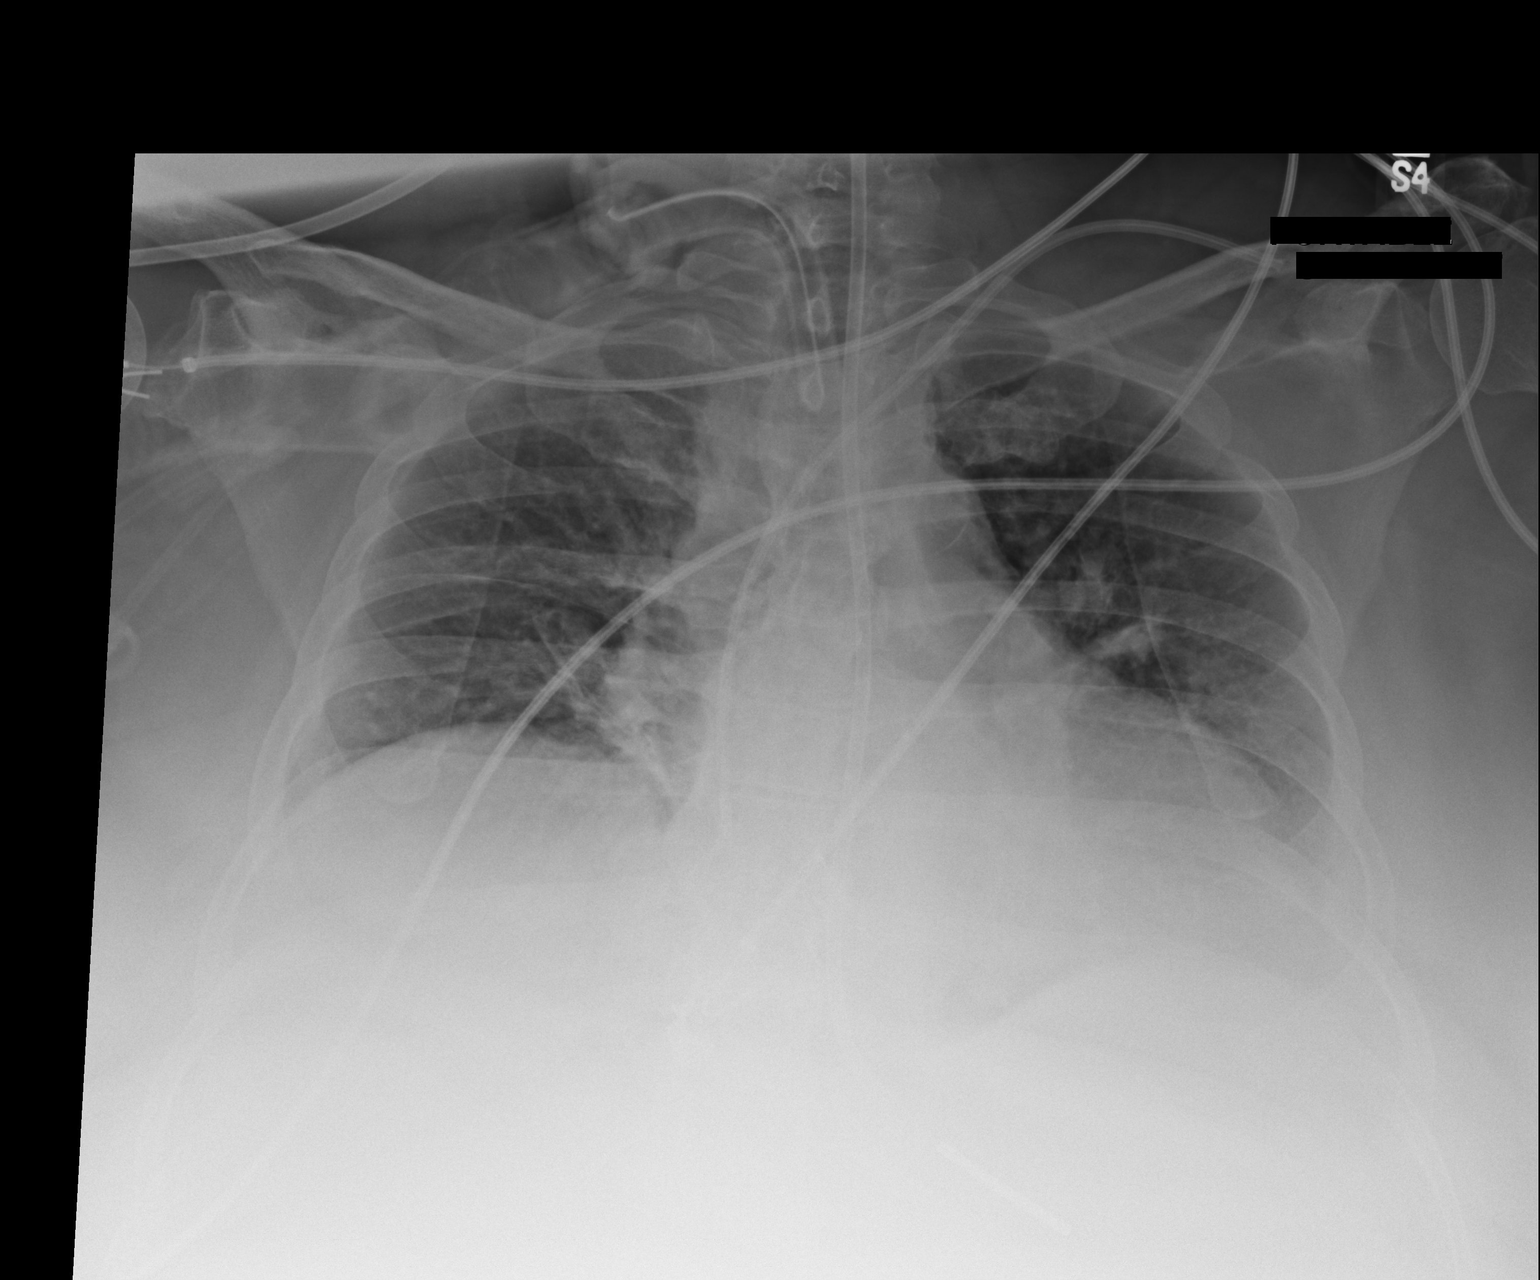

[1 of 1 positions shown; findings below may reference images not displayed]

FINDINGS: Tracheostomy tube tip overlies the tracheal air column at the
thoracic inlet. Left internal jugular central venous catheter
terminates in the right atrium near the cavoatrial junction. Stable
cardiomediastinal silhouette with mild cardiomegaly. No
pneumothorax. Stable small left pleural effusion. No right pleural
effusion. Low lung volumes. Vascular crowding without overt
pulmonary edema. Improved aeration at the left lung base, with
decreased hazy left lower lobe opacity. Platelike atelectasis at the
right lung base, stable.
IMPRESSION: 1. Low lung volumes with stable platelike atelectasis at the right
lung base and decreased hazy left lower lobe opacity, either
atelectasis or pneumonia.
2. Stable small left pleural effusion.

## 2017-08-20 IMAGING — CR DG CHEST 1V PORT
1 series · 1 of 1 positions shown · non-contrast
Comparison: 11/22/2015

CLINICAL DATA: Respiratory failure

EXAM:
PORTABLE CHEST 1 VIEW

[AP]
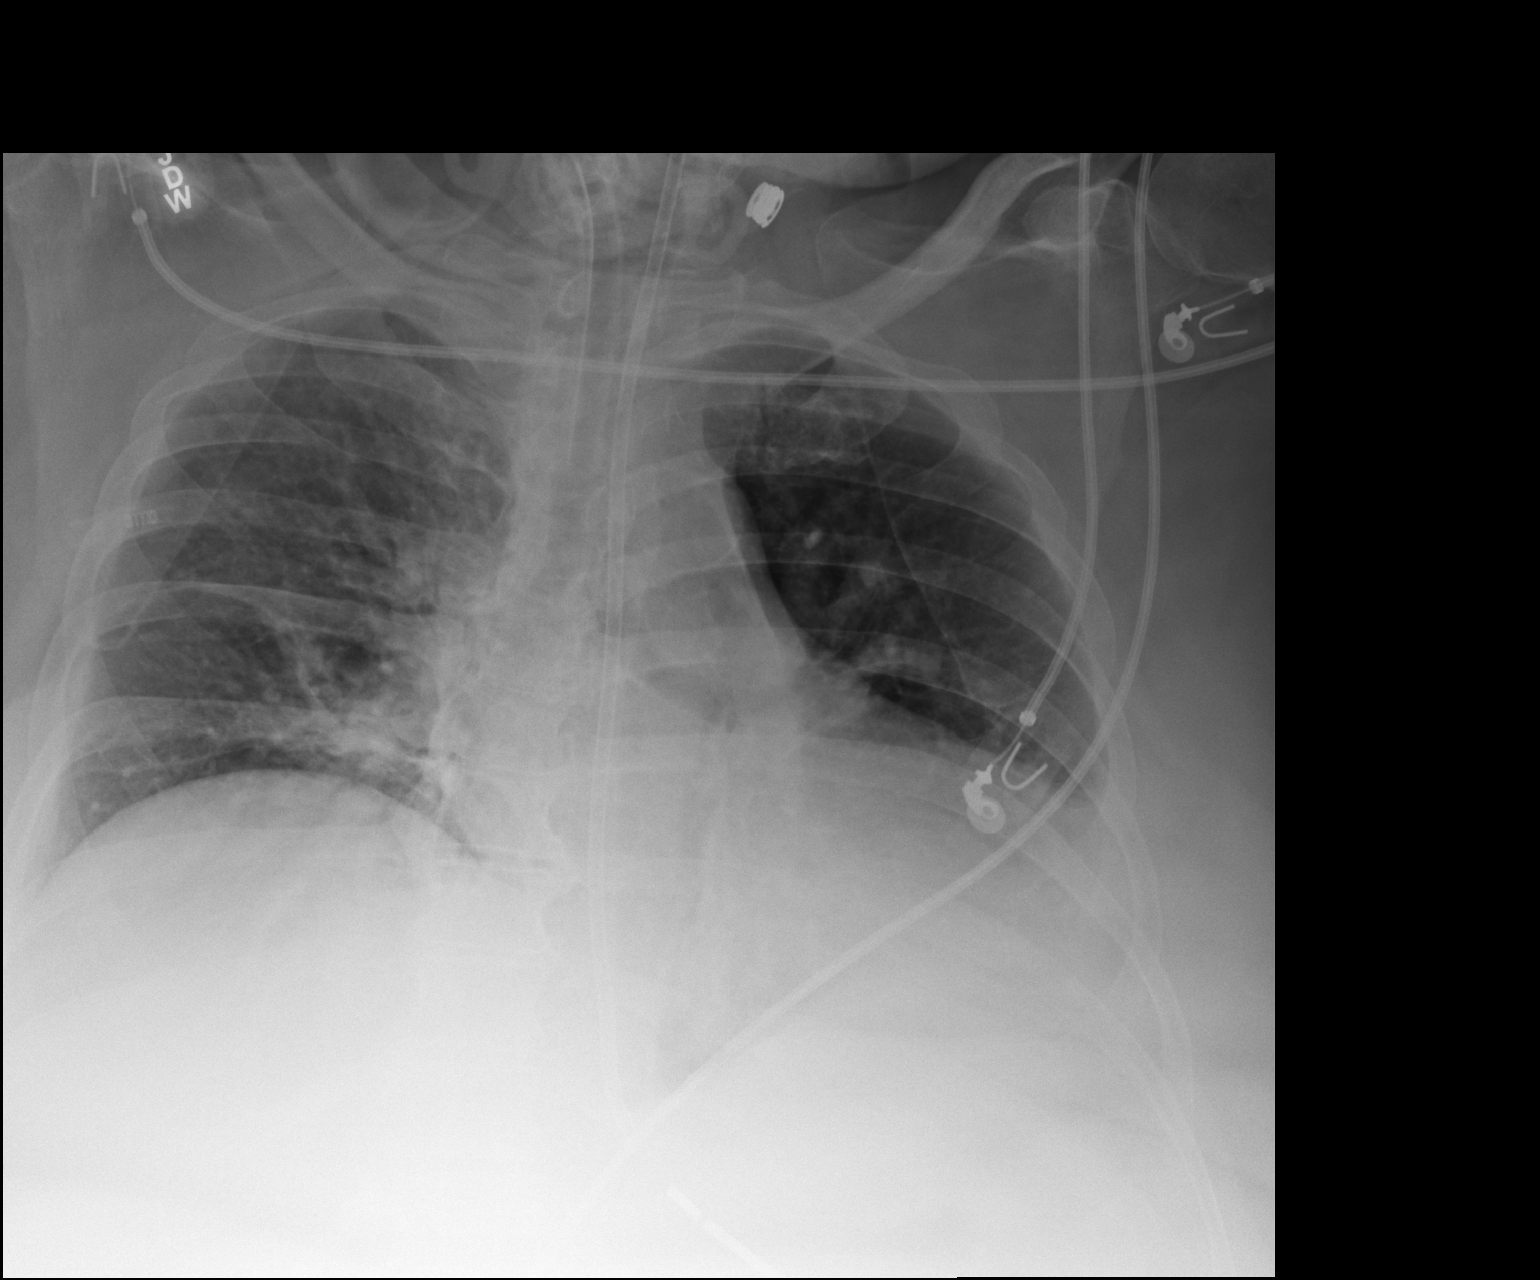

[1 of 1 positions shown; findings below may reference images not displayed]

FINDINGS: Support devices are unchanged. Low lung volumes with vascular
congestion and bibasilar opacities, likely atelectasis. Stable
cardiomegaly. No real change.
IMPRESSION: Continued low lung volumes with vascular congestion and bibasilar
atelectasis.

## 2024-06-03 DEATH — deceased
